# Patient Record
Sex: Female | Born: 1960
Health system: Southern US, Community
[De-identification: ages and names within clinical notes are randomized; demographics above are authoritative.]

## PROBLEM LIST (undated history)

## (undated) DIAGNOSIS — K219 Gastro-esophageal reflux disease without esophagitis: Secondary | ICD-10-CM

## (undated) DIAGNOSIS — I10 Essential (primary) hypertension: Secondary | ICD-10-CM

## (undated) DIAGNOSIS — E785 Hyperlipidemia, unspecified: Secondary | ICD-10-CM

## (undated) DIAGNOSIS — Z9889 Other specified postprocedural states: Secondary | ICD-10-CM

## (undated) DIAGNOSIS — R112 Nausea with vomiting, unspecified: Secondary | ICD-10-CM

## (undated) DIAGNOSIS — F329 Major depressive disorder, single episode, unspecified: Secondary | ICD-10-CM

## (undated) DIAGNOSIS — M1990 Unspecified osteoarthritis, unspecified site: Secondary | ICD-10-CM

## (undated) DIAGNOSIS — E039 Hypothyroidism, unspecified: Secondary | ICD-10-CM

## (undated) DIAGNOSIS — F32A Depression, unspecified: Secondary | ICD-10-CM

## (undated) HISTORY — PX: CARPAL TUNNEL RELEASE: SHX101

## (undated) HISTORY — PX: TRIGGER FINGER RELEASE: SHX641

## (undated) HISTORY — PX: TUBAL LIGATION: SHX77

---

## 1998-01-27 ENCOUNTER — Other Ambulatory Visit: Admission: RE | Admit: 1998-01-27 | Discharge: 1998-01-27 | Payer: Self-pay | Admitting: *Deleted

## 2004-07-27 ENCOUNTER — Encounter: Admission: RE | Admit: 2004-07-27 | Discharge: 2004-07-27 | Payer: Self-pay | Admitting: Family Medicine

## 2005-04-02 ENCOUNTER — Ambulatory Visit (HOSPITAL_COMMUNITY): Admission: RE | Admit: 2005-04-02 | Discharge: 2005-04-02 | Payer: Self-pay | Admitting: Chiropractic Medicine

## 2005-08-24 ENCOUNTER — Emergency Department (HOSPITAL_COMMUNITY): Admission: EM | Admit: 2005-08-24 | Discharge: 2005-08-24 | Payer: Self-pay | Admitting: Emergency Medicine

## 2006-02-20 ENCOUNTER — Ambulatory Visit (HOSPITAL_COMMUNITY): Admission: RE | Admit: 2006-02-20 | Discharge: 2006-02-20 | Payer: Self-pay | Admitting: Endocrinology

## 2007-12-03 ENCOUNTER — Ambulatory Visit (HOSPITAL_COMMUNITY): Admission: RE | Admit: 2007-12-03 | Discharge: 2007-12-03 | Payer: Self-pay | Admitting: Chiropractic Medicine

## 2008-07-23 HISTORY — PX: CHOLECYSTECTOMY: SHX55

## 2008-08-13 ENCOUNTER — Encounter: Admission: RE | Admit: 2008-08-13 | Discharge: 2008-08-13 | Payer: Self-pay | Admitting: Endocrinology

## 2009-03-14 ENCOUNTER — Ambulatory Visit (HOSPITAL_COMMUNITY): Admission: RE | Admit: 2009-03-14 | Discharge: 2009-03-14 | Payer: Self-pay | Admitting: Endocrinology

## 2009-04-26 ENCOUNTER — Observation Stay (HOSPITAL_COMMUNITY): Admission: RE | Admit: 2009-04-26 | Discharge: 2009-04-27 | Payer: Self-pay | Admitting: General Surgery

## 2009-04-26 ENCOUNTER — Encounter (INDEPENDENT_AMBULATORY_CARE_PROVIDER_SITE_OTHER): Payer: Self-pay | Admitting: General Surgery

## 2010-07-18 IMAGING — US US ABDOMEN COMPLETE
1 series · 14 of 25 positions shown · non-contrast
Comparison: None

CLINICAL DATA: Epigastric pain with reflux.

ABDOMEN ULTRASOUND
TECHNIQUE: Complete abdominal ultrasound examination was performed
including evaluation of the liver, gallbladder, bile ducts,
pancreas, kidneys, spleen, IVC, and abdominal aorta.

[Series 1: us abdomen complete · 0.30mm/px · 14 of 81 slices shown]
[im 1/81]
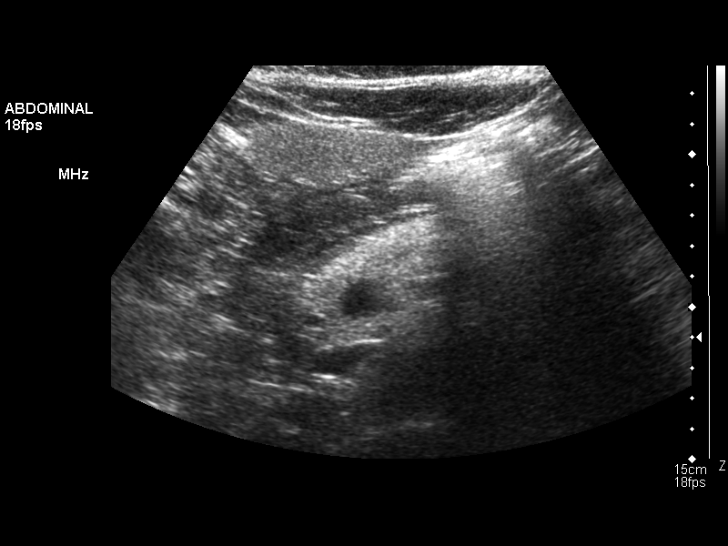
[im 7/81]
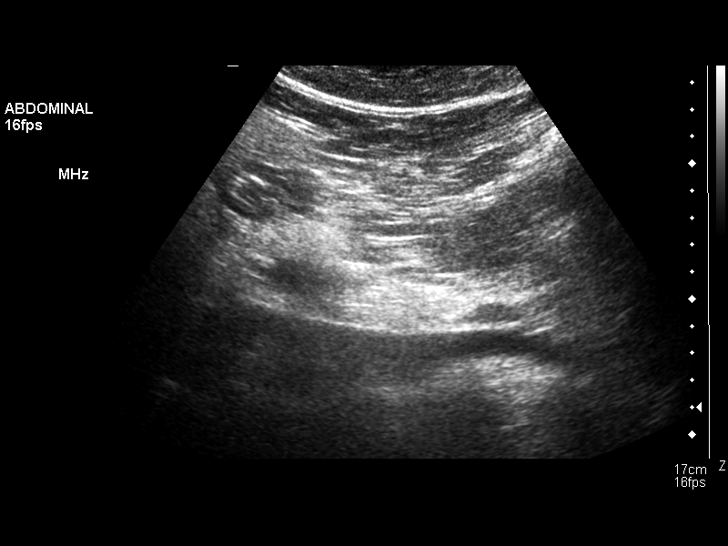
[im 14/81]
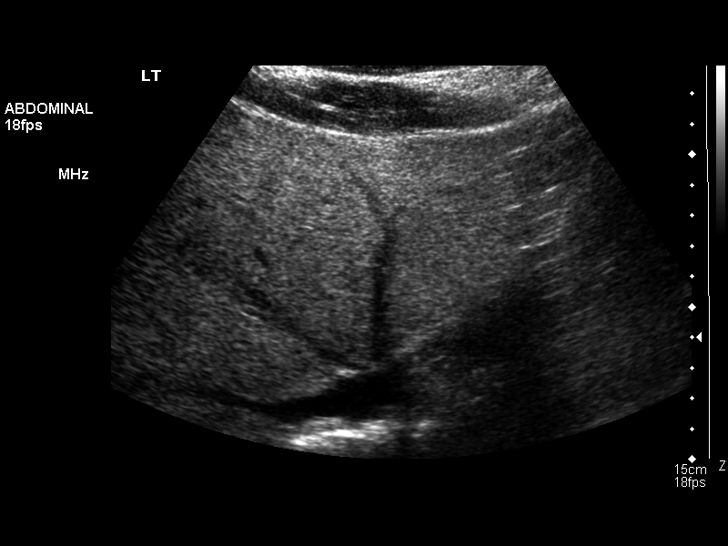
[im 21/81]
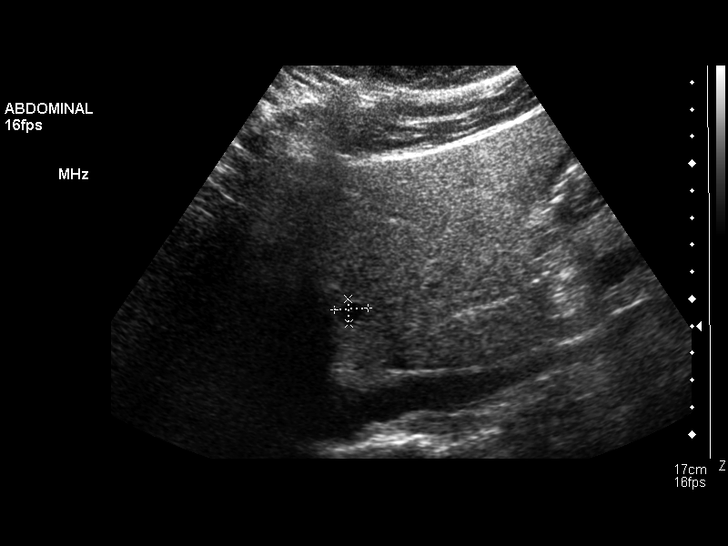
[im 27/81]
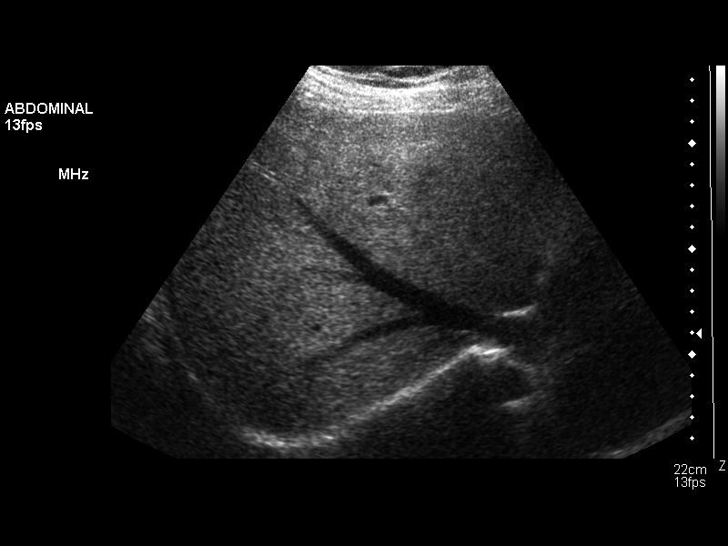
[im 31/81]
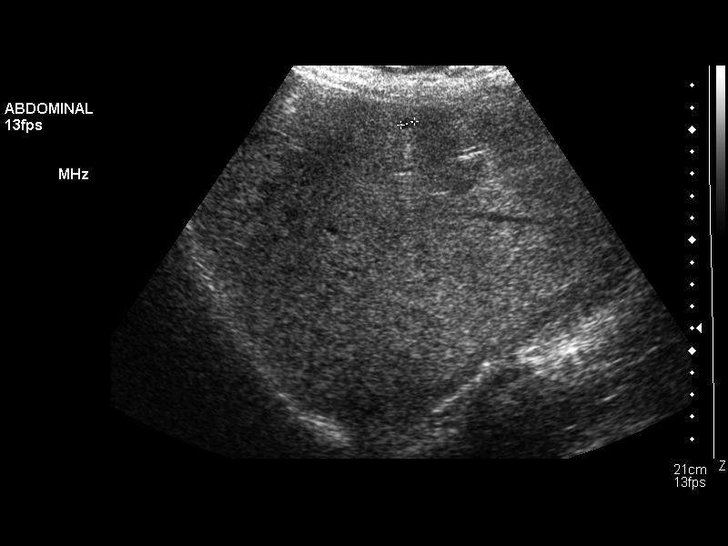
[im 37/81]
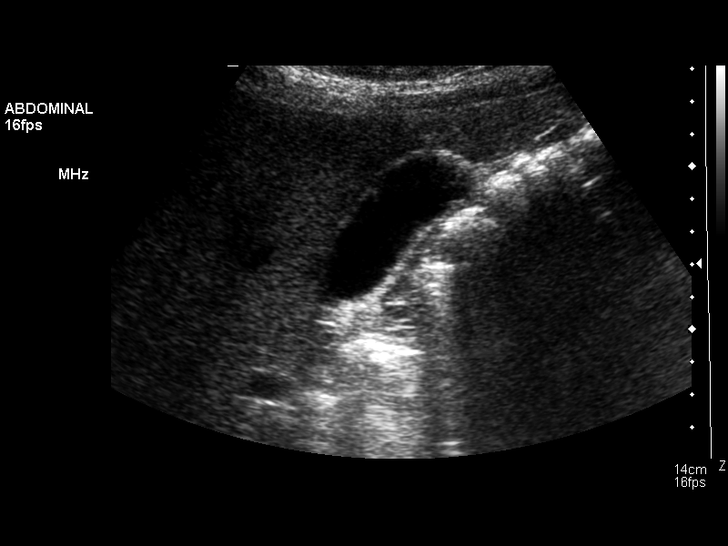
[im 44/81]
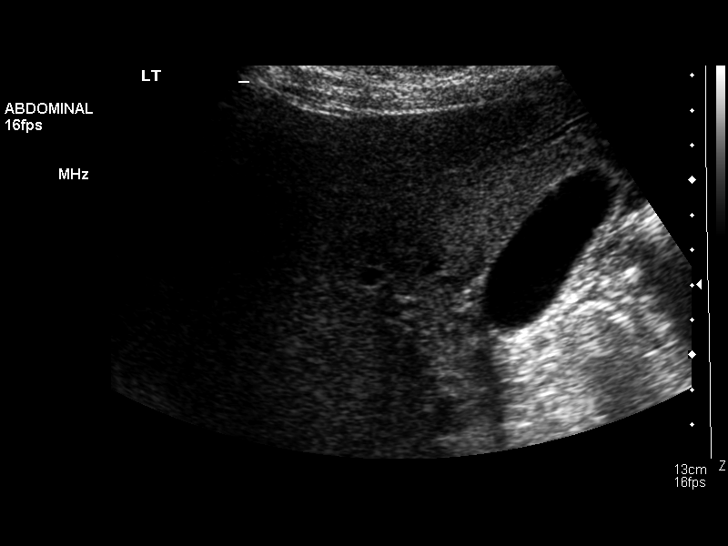
[im 51/81]
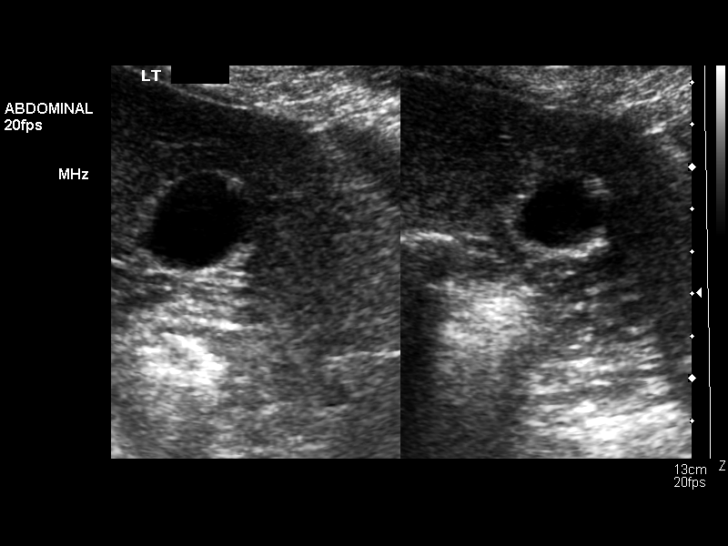
[im 54/81]
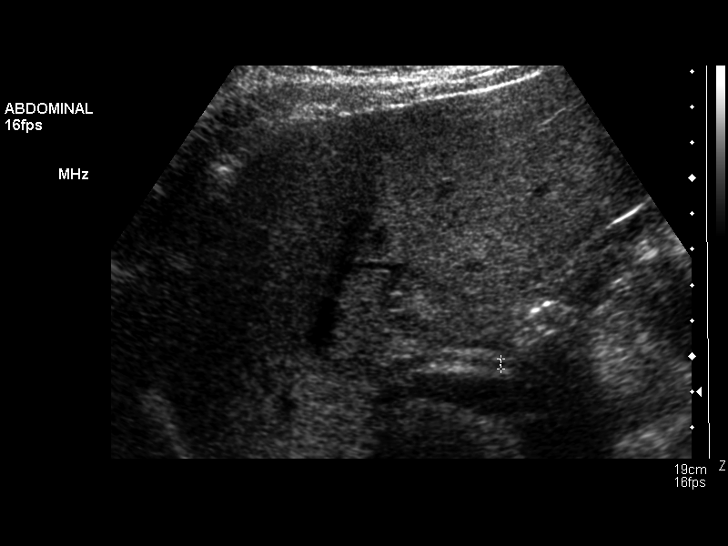
[im 61/81]
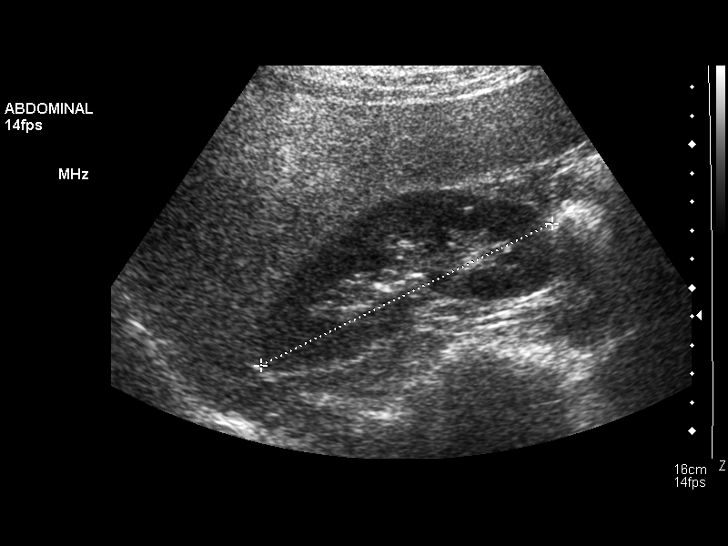
[im 67/81]
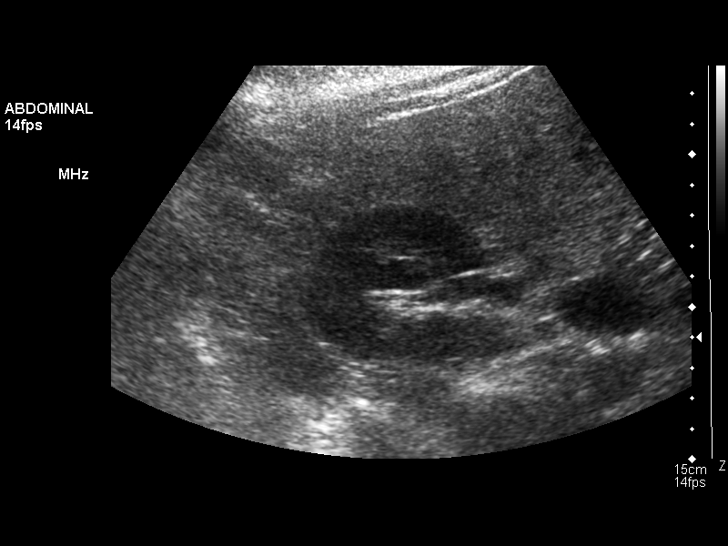
[im 74/81]
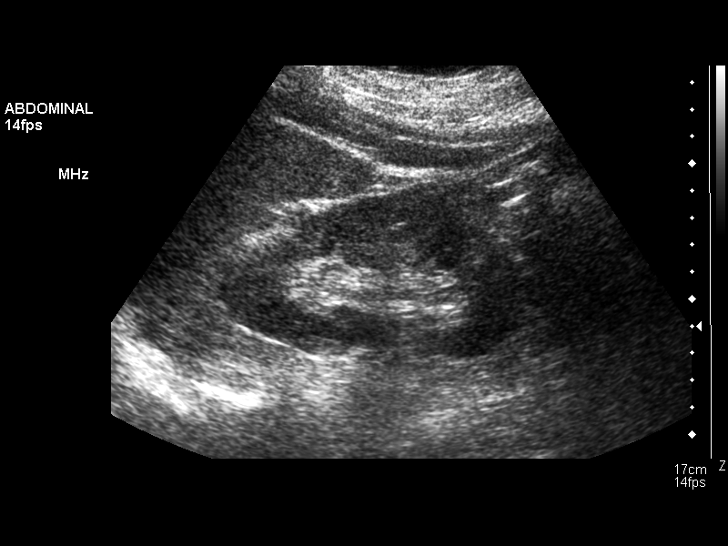
[im 81/81]
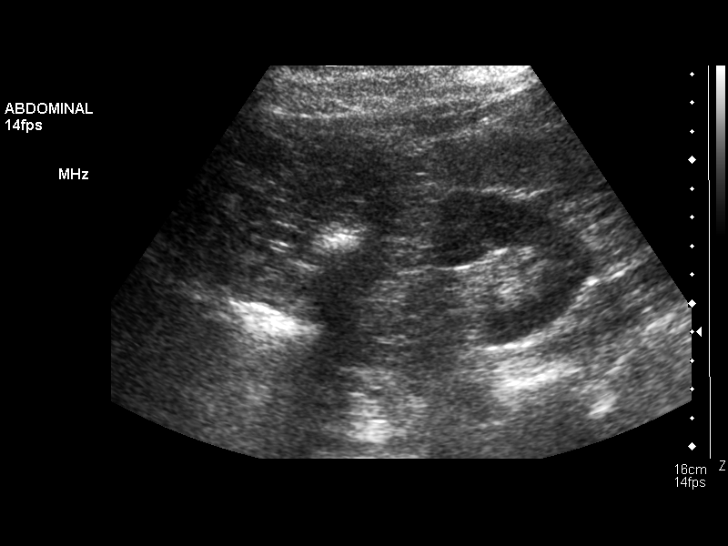

[14 of 25 positions shown; findings below may reference images not displayed]

FINDINGS: Liver is slightly increased in echogenicity and contains
scattered anechoic lesions with increased through transmission,
measuring up to 1.3 x 0.9 x 1.2 cm, consistent with cysts.  There
are tiny echogenic foci along the gallbladder wall, which are non
mobile.  Some of these show ring down artifact.  Gallbladder wall
measures 2 mm.  IVC, visualized portion of the pancreas, spleen,
kidneys and aorta are unremarkable.
IMPRESSION: 1.  Mildly fatty liver.
2.  Probable gallbladder polyps.  Adenomyomatosis cannot be
excluded.

## 2010-09-11 ENCOUNTER — Encounter: Payer: BC Managed Care – PPO | Attending: Endocrinology | Admitting: *Deleted

## 2010-09-11 DIAGNOSIS — Z713 Dietary counseling and surveillance: Secondary | ICD-10-CM | POA: Insufficient documentation

## 2010-09-11 DIAGNOSIS — E669 Obesity, unspecified: Secondary | ICD-10-CM | POA: Insufficient documentation

## 2010-10-27 LAB — BASIC METABOLIC PANEL
BUN: 15 mg/dL (ref 6–23)
CO2: 26 mEq/L (ref 19–32)
Calcium: 8.7 mg/dL (ref 8.4–10.5)
Chloride: 107 mEq/L (ref 96–112)
Creatinine, Ser: 0.73 mg/dL (ref 0.4–1.2)
GFR calc Af Amer: >60 mL/min (ref >60)
GFR calc non Af Amer: >60 mL/min (ref >60)
Glucose, Bld: 103 mg/dL — ABNORMAL HIGH (ref 70–99)
Potassium: 4.3 meq/L (ref 3.5–5.1)
Sodium: 141 meq/L (ref 135–145)

## 2010-10-27 LAB — CBC
HCT: 41.8 % (ref 36.0–46.0)
Hemoglobin: 14.4 g/dL (ref 12.0–15.0)
MCHC: 34.4 g/dL (ref 30.0–36.0)
MCV: 89 fL (ref 78.0–100.0)
Platelets: 193 10*3/uL (ref 150–400)
RBC: 4.69 MIL/uL (ref 3.87–5.11)
RDW: 12.9 % (ref 11.5–15.5)
WBC: 9.6 10*3/uL (ref 4.0–10.5)

## 2010-12-22 ENCOUNTER — Other Ambulatory Visit: Payer: Self-pay | Admitting: Gastroenterology

## 2011-02-16 IMAGING — NM NM HEPATO W/GB/PHARM/[PERSON_NAME]
1 series · 12 of 12 positions shown · non-contrast
Comparison: Ultrasound 08/13/2008

Addendum Begins

Addendum:  Please disregard the third sentence in the findings
first paragraph (gallbladder appears small or contracted.). This
was mistakenly reported for the wrong patient.  Remainder of the
report is accurate.
Addendum Ends
CLINICAL DATA: Right upper quadrant pain, nausea, vomiting.
NUCLEAR MEDICINE HEPATOBILIARY IMAGING WITH GALLBLADDER EF
TECHNIQUE: Sequential images of the abdomen were obtained [DATE]
minutes following intravenous administration of
radiopharmaceutical.  After slow intravenous infusion of 1.4ucg
Cholecystokinin, gallbladder ejection fraction was determined.
Radiopharmaceutical:  F.FmXi Yc-JJm Choletec

[Series 1: antr · 4.46mm/px · 2 acquisitions, 12 frames shown]
[im 1/2]
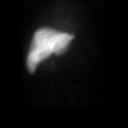
[im 1/2]
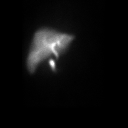
[im 1/2]
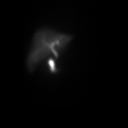
[im 1/2]
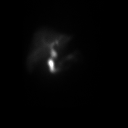
[im 1/2]
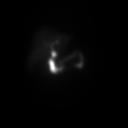
[im 1/2]
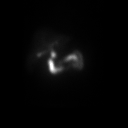
[im 2/2]
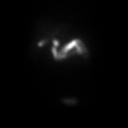
[im 2/2]
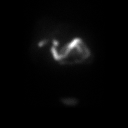
[im 2/2]
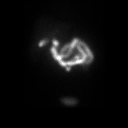
[im 2/2]
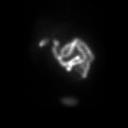
[im 2/2]
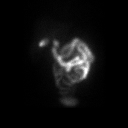
[im 2/2]
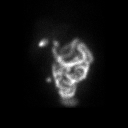

[12 of 12 positions shown; findings below may reference images not displayed]

FINDINGS: There is prompt uptake and excretion of radiotracer by
the liver.  No evidence of cystic duct or common duct obstruction.
Gallbladder appears small bore contracted. Gallbladder ejection
fraction is 31%.  Normal is greater than 30%.

The patient did not experience symptoms during CCK infusion.
IMPRESSION: Gallbladder ejection fraction lower limits of normal.  No symptoms
with CCK administration.

## 2013-07-06 ENCOUNTER — Other Ambulatory Visit: Payer: Self-pay | Admitting: Orthopaedic Surgery

## 2013-07-07 ENCOUNTER — Other Ambulatory Visit: Payer: Self-pay | Admitting: Orthopaedic Surgery

## 2013-07-08 ENCOUNTER — Encounter (HOSPITAL_BASED_OUTPATIENT_CLINIC_OR_DEPARTMENT_OTHER): Payer: Self-pay | Admitting: *Deleted

## 2013-07-08 NOTE — Progress Notes (Signed)
07/08/13 1610  OBSTRUCTIVE SLEEP APNEA  Have you ever been diagnosed with sleep apnea through a sleep study? No  Do you snore loudly (loud enough to be heard through closed doors)?  0  Do you often feel tired, fatigued, or sleepy during the daytime? 0  Has anyone observed you stop breathing during your sleep? 0  Do you have, or are you being treated for high blood pressure? 1  BMI more than 35 kg/m2? 1  Age over 54 years old? 1  Neck circumference greater than 40 cm/18 inches? 1  Gender: 0  Obstructive Sleep Apnea Score 4  Score 4 or greater  Results sent to PCP

## 2013-07-08 NOTE — Progress Notes (Signed)
To come in for ekg-bmet-denies any cardiac or resp-denies sleep apnea

## 2013-07-09 ENCOUNTER — Encounter (HOSPITAL_BASED_OUTPATIENT_CLINIC_OR_DEPARTMENT_OTHER)
Admission: RE | Admit: 2013-07-09 | Discharge: 2013-07-09 | Disposition: A | Discharge status: Home / Self Care | Payer: BC Managed Care – PPO | Source: Ambulatory Visit | Attending: Orthopaedic Surgery | Admitting: Orthopaedic Surgery

## 2013-07-09 ENCOUNTER — Other Ambulatory Visit: Payer: Self-pay

## 2013-07-09 DIAGNOSIS — Z01810 Encounter for preprocedural cardiovascular examination: Secondary | ICD-10-CM | POA: Insufficient documentation

## 2013-07-09 DIAGNOSIS — Z01812 Encounter for preprocedural laboratory examination: Secondary | ICD-10-CM | POA: Insufficient documentation

## 2013-07-09 LAB — BASIC METABOLIC PANEL
BUN: 13 mg/dL (ref 6–23)
CO2: 27 mEq/L (ref 19–32)
Calcium: 9.2 mg/dL (ref 8.4–10.5)
Chloride: 102 meq/L (ref 96–112)
Creatinine, Ser: 0.7 mg/dL (ref 0.50–1.10)
GFR calc Af Amer: >90 mL/min (ref >90)
GFR calc non Af Amer: >90 mL/min (ref >90)
Glucose, Bld: 96 mg/dL (ref 70–99)
Potassium: 4 mEq/L (ref 3.5–5.1)
Sodium: 139 mEq/L (ref 135–145)

## 2013-07-10 NOTE — H&P (Signed)
Wendy Reed is an 52 y.o. female.   Chief Complaint: Left shoulder pain HPI: Wendy Reed has having increasing left shoulder pain that is painful with activities and at nighttime on trying to rest.  She has been on a exercise program, anti-inflammatory medicine and injection of cortisone which all helped temporarily.  Recent MRI scan is positive for mild glenohumeral and a.c. Degeneration .  Rotator cuff is irritated but no full-thickness tear.  Date of scan 06/14/13.  We have discussed proceeding with a shoulder arthroscopy to relieve painful symptoms and improve function.  Past Medical History  Diagnosis Date  . Hypertension   . Hyperlipemia   . Arthritis   . GERD (gastroesophageal reflux disease)   . Hypothyroidism   . Depression   . PONV (postoperative nausea and vomiting)     Past Surgical History  Procedure Laterality Date  . Cesarean section    . Cholecystectomy  2010    lap choli  . Carpal tunnel release      right and left  . Trigger finger release      lt thumb  . Tubal ligation      History reviewed. No pertinent family history. Social History:  reports that she quit smoking about 9 years ago. She does not have any smokeless tobacco history on file. She reports that she does not drink alcohol or use illicit drugs.  Allergies:  Allergies  Allergen Reactions  . Codeine Nausea And Vomiting  . Penicillins Hives    No prescriptions prior to admission    Results for orders placed during the hospital encounter of 07/14/13 (from the past 48 hour(s))  BASIC METABOLIC PANEL     Status: None   Collection Time    07/09/13 12:00 PM      Result Value Range   Sodium 139  135 - 145 mEq/L   Potassium 4.0  3.5 - 5.1 mEq/L   Chloride 102  96 - 112 mEq/L   CO2 27  19 - 32 mEq/L   Glucose, Bld 96  70 - 99 mg/dL   BUN 13  6 - 23 mg/dL   Creatinine, Ser 2.95  0.50 - 1.10 mg/dL   Calcium 9.2  8.4 - 62.1 mg/dL   GFR calc non Af Amer >90  >90 mL/min   GFR calc Af Amer >90  >90  mL/min   Comment: (NOTE)     The eGFR has been calculated using the CKD EPI equation.     This calculation has not been validated in all clinical situations.     eGFR's persistently <90 mL/min signify possible Chronic Kidney     Disease.   No results found.  Review of Systems  Constitutional: Negative.   HENT: Negative.   Eyes: Negative.   Respiratory: Negative.   Cardiovascular: Negative.   Genitourinary: Negative.   Musculoskeletal: Positive for joint pain.  Skin: Negative.   Neurological: Negative.   Endo/Heme/Allergies: Negative.   Psychiatric/Behavioral: Negative.     Height 5\' 4"  (1.626 m), weight 117.028 kg (258 lb). Physical Exam  Constitutional: She appears well-nourished.  HENT:  Head: Normocephalic.  Eyes: Pupils are equal, round, and reactive to light.  Neck: Normal range of motion.  Cardiovascular: Normal rate.   Respiratory: Effort normal.  GI: Soft.  Musculoskeletal:  Left shoulder exam: Range of motion: 150, 45 and hip pocket.  Cuff strength testing is good.  She does have mildly positive cross chest test pain for a.c. Joint discomfort.  Marked secondary impingement  and primary impingement both.  Neurological: She is alert.  Skin: Skin is dry.     Assessment/Plan Left shoulder impingement and a.c. Joint pain.  Plan: We have discussed proceeding with a left shoulder arthroscopy to stop her painful symptoms and improve her function.  We have discussed the risks of anesthesia, infection and Complications associated with shoulder arthroscopy.We have also discussed the need for postoperative physical therapy to optimize results.  Wendy Reed R 07/10/2013, 1:18 PM

## 2013-07-14 ENCOUNTER — Ambulatory Visit (HOSPITAL_BASED_OUTPATIENT_CLINIC_OR_DEPARTMENT_OTHER): Payer: BC Managed Care – PPO | Admitting: Anesthesiology

## 2013-07-14 ENCOUNTER — Ambulatory Visit (HOSPITAL_BASED_OUTPATIENT_CLINIC_OR_DEPARTMENT_OTHER)
Admission: RE | Admit: 2013-07-14 | Discharge: 2013-07-14 | Disposition: A | Discharge status: Home / Self Care | Payer: BC Managed Care – PPO | Source: Ambulatory Visit | Attending: Orthopaedic Surgery | Admitting: Orthopaedic Surgery

## 2013-07-14 ENCOUNTER — Encounter (HOSPITAL_BASED_OUTPATIENT_CLINIC_OR_DEPARTMENT_OTHER): Payer: Self-pay | Admitting: *Deleted

## 2013-07-14 ENCOUNTER — Encounter (HOSPITAL_BASED_OUTPATIENT_CLINIC_OR_DEPARTMENT_OTHER): Payer: BC Managed Care – PPO | Admitting: Anesthesiology

## 2013-07-14 ENCOUNTER — Encounter (HOSPITAL_BASED_OUTPATIENT_CLINIC_OR_DEPARTMENT_OTHER)
Admission: RE | Disposition: A | Discharge status: Home / Self Care | Payer: Self-pay | Source: Ambulatory Visit | Attending: Orthopaedic Surgery

## 2013-07-14 DIAGNOSIS — I10 Essential (primary) hypertension: Secondary | ICD-10-CM | POA: Insufficient documentation

## 2013-07-14 DIAGNOSIS — E785 Hyperlipidemia, unspecified: Secondary | ICD-10-CM | POA: Insufficient documentation

## 2013-07-14 DIAGNOSIS — F329 Major depressive disorder, single episode, unspecified: Secondary | ICD-10-CM | POA: Insufficient documentation

## 2013-07-14 DIAGNOSIS — Z9889 Other specified postprocedural states: Secondary | ICD-10-CM

## 2013-07-14 DIAGNOSIS — E039 Hypothyroidism, unspecified: Secondary | ICD-10-CM | POA: Insufficient documentation

## 2013-07-14 DIAGNOSIS — M75110 Incomplete rotator cuff tear or rupture of unspecified shoulder, not specified as traumatic: Secondary | ICD-10-CM | POA: Insufficient documentation

## 2013-07-14 DIAGNOSIS — Z87891 Personal history of nicotine dependence: Secondary | ICD-10-CM | POA: Insufficient documentation

## 2013-07-14 DIAGNOSIS — K219 Gastro-esophageal reflux disease without esophagitis: Secondary | ICD-10-CM | POA: Insufficient documentation

## 2013-07-14 DIAGNOSIS — M19019 Primary osteoarthritis, unspecified shoulder: Secondary | ICD-10-CM | POA: Insufficient documentation

## 2013-07-14 DIAGNOSIS — F3289 Other specified depressive episodes: Secondary | ICD-10-CM | POA: Insufficient documentation

## 2013-07-14 HISTORY — DX: Major depressive disorder, single episode, unspecified: F32.9

## 2013-07-14 HISTORY — DX: Hypothyroidism, unspecified: E03.9

## 2013-07-14 HISTORY — DX: Gastro-esophageal reflux disease without esophagitis: K21.9

## 2013-07-14 HISTORY — PX: SHOULDER ARTHROSCOPY: SHX128

## 2013-07-14 HISTORY — DX: Other specified postprocedural states: Z98.890

## 2013-07-14 HISTORY — DX: Nausea with vomiting, unspecified: R11.2

## 2013-07-14 HISTORY — DX: Essential (primary) hypertension: I10

## 2013-07-14 HISTORY — DX: Depression, unspecified: F32.A

## 2013-07-14 HISTORY — DX: Unspecified osteoarthritis, unspecified site: M19.90

## 2013-07-14 HISTORY — DX: Hyperlipidemia, unspecified: E78.5

## 2013-07-14 LAB — POCT HEMOGLOBIN-HEMACUE: Hemoglobin: 14.4 g/dL (ref 12.0–15.0)

## 2013-07-14 SURGERY — ARTHROSCOPY, SHOULDER
Anesthesia: General | Site: Shoulder | Laterality: Left

## 2013-07-14 MED ORDER — PROPOFOL 10 MG/ML IV BOLUS
INTRAVENOUS | Status: DC | PRN
  Administered 2013-07-14: 200 mg via INTRAVENOUS

## 2013-07-14 MED ORDER — SUCCINYLCHOLINE CHLORIDE 20 MG/ML IJ SOLN
INTRAMUSCULAR | Status: DC | PRN
  Administered 2013-07-14: 100 mg via INTRAVENOUS

## 2013-07-14 MED ORDER — ONDANSETRON HCL 4 MG/2ML IJ SOLN: 4.0000 mg | Freq: Once | INTRAMUSCULAR | Status: DC | PRN

## 2013-07-14 MED ORDER — SODIUM CHLORIDE 0.9 % IV SOLN
INTRAVENOUS | Status: DC | PRN
  Administered 2013-07-14: 6000 mL

## 2013-07-14 MED ORDER — FENTANYL CITRATE 0.05 MG/ML IJ SOLN
INTRAMUSCULAR | Status: AC
  Filled 2013-07-14: qty 4

## 2013-07-14 MED ORDER — BUPIVACAINE-EPINEPHRINE PF 0.5-1:200000 % IJ SOLN
INTRAMUSCULAR | Status: DC | PRN
  Administered 2013-07-14: 30 mL via PERINEURAL

## 2013-07-14 MED ORDER — FENTANYL CITRATE 0.05 MG/ML IJ SOLN
INTRAMUSCULAR | Status: AC
  Filled 2013-07-14: qty 2

## 2013-07-14 MED ORDER — MIDAZOLAM HCL 2 MG/2ML IJ SOLN
1.0000 mg | INTRAMUSCULAR | Status: DC | PRN
  Administered 2013-07-14: 2 mg via INTRAVENOUS

## 2013-07-14 MED ORDER — OXYCODONE HCL 5 MG/5ML PO SOLN: 5.0000 mg | Freq: Once | ORAL | Status: DC | PRN

## 2013-07-14 MED ORDER — DEXAMETHASONE SODIUM PHOSPHATE 4 MG/ML IJ SOLN
INTRAMUSCULAR | Status: DC | PRN
  Administered 2013-07-14: 10 mg via INTRAVENOUS

## 2013-07-14 MED ORDER — FENTANYL CITRATE 0.05 MG/ML IJ SOLN
50.0000 ug | INTRAMUSCULAR | Status: DC | PRN
  Administered 2013-07-14: 100 ug via INTRAVENOUS

## 2013-07-14 MED ORDER — LIDOCAINE HCL (CARDIAC) 20 MG/ML IV SOLN
INTRAVENOUS | Status: DC | PRN
  Administered 2013-07-14: 100 mg via INTRAVENOUS

## 2013-07-14 MED ORDER — LACTATED RINGERS IV SOLN
INTRAVENOUS | Status: DC
  Administered 2013-07-14: 07:00:00 via INTRAVENOUS

## 2013-07-14 MED ORDER — ONDANSETRON HCL 4 MG/2ML IJ SOLN
INTRAMUSCULAR | Status: DC | PRN
  Administered 2013-07-14: 4 mg via INTRAVENOUS

## 2013-07-14 MED ORDER — METHYLPREDNISOLONE ACETATE 40 MG/ML IJ SUSP
INTRAMUSCULAR | Status: DC | PRN
  Administered 2013-07-14: 40 mg via INTRA_ARTICULAR

## 2013-07-14 MED ORDER — HYDROMORPHONE HCL PF 1 MG/ML IJ SOLN: 0.2500 mg | INTRAMUSCULAR | Status: DC | PRN

## 2013-07-14 MED ORDER — BUPIVACAINE-EPINEPHRINE 0.25% -1:200000 IJ SOLN
INTRAMUSCULAR | Status: DC | PRN
  Administered 2013-07-14: 5 mL

## 2013-07-14 MED ORDER — CEFAZOLIN SODIUM-DEXTROSE 2-3 GM-% IV SOLR
INTRAVENOUS | Status: DC | PRN
  Administered 2013-07-14: 2 g via INTRAVENOUS

## 2013-07-14 MED ORDER — MIDAZOLAM HCL 2 MG/2ML IJ SOLN
INTRAMUSCULAR | Status: AC
  Filled 2013-07-14: qty 2

## 2013-07-14 MED ORDER — METHYLPREDNISOLONE ACETATE 40 MG/ML IJ SUSP
INTRAMUSCULAR | Status: AC
  Filled 2013-07-14: qty 1

## 2013-07-14 MED ORDER — LACTATED RINGERS IV SOLN: INTRAVENOUS | Status: DC

## 2013-07-14 MED ORDER — CHLORHEXIDINE GLUCONATE 4 % EX LIQD: 60.0000 mL | Freq: Once | CUTANEOUS | Status: DC

## 2013-07-14 MED ORDER — MEPERIDINE HCL 25 MG/ML IJ SOLN: 6.2500 mg | INTRAMUSCULAR | Status: DC | PRN

## 2013-07-14 MED ORDER — CEFAZOLIN SODIUM-DEXTROSE 2-3 GM-% IV SOLR
INTRAVENOUS | Status: AC
  Filled 2013-07-14: qty 50

## 2013-07-14 MED ORDER — OXYCODONE HCL 5 MG PO TABS: 5.0000 mg | ORAL_TABLET | Freq: Once | ORAL | Status: DC | PRN

## 2013-07-14 MED ORDER — BUPIVACAINE-EPINEPHRINE PF 0.25-1:200000 % IJ SOLN
INTRAMUSCULAR | Status: AC
  Filled 2013-07-14: qty 30

## 2013-07-14 MED ORDER — TRAMADOL HCL 50 MG PO TABS: 50.0000 mg | ORAL_TABLET | Freq: Four times a day (QID) | ORAL | Status: AC | PRN

## 2013-07-14 SURGICAL SUPPLY — 74 items
ADH SKN CLS APL DERMABOND .7 (GAUZE/BANDAGES/DRESSINGS)
APL SKNCLS STERI-STRIP NONHPOA (GAUZE/BANDAGES/DRESSINGS)
BENZOIN TINCTURE PRP APPL 2/3 (GAUZE/BANDAGES/DRESSINGS) IMPLANT
BLADE CUDA 5.5 (BLADE) IMPLANT
BLADE GREAT WHITE 4.2 (BLADE) ×2 IMPLANT
BLADE SURG 15 STRL LF DISP TIS (BLADE) IMPLANT
BLADE SURG 15 STRL SS (BLADE)
BUR VERTEX HOODED 4.5 (BURR) ×2 IMPLANT
CANISTER SUCT 3000ML (MISCELLANEOUS) IMPLANT
CANISTER SUCT LVC 12 LTR MEDI- (MISCELLANEOUS) ×2 IMPLANT
CANNULA SHOULDER 7CM (CANNULA) ×2 IMPLANT
CANNULA TWIST IN 8.25X7CM (CANNULA) IMPLANT
DECANTER SPIKE VIAL GLASS SM (MISCELLANEOUS) IMPLANT
DERMABOND ADVANCED (GAUZE/BANDAGES/DRESSINGS)
DERMABOND ADVANCED .7 DNX12 (GAUZE/BANDAGES/DRESSINGS) IMPLANT
DRAPE STERI 35X30 U-POUCH (DRAPES) ×2 IMPLANT
DRAPE U-SHAPE 47X51 STRL (DRAPES) ×2 IMPLANT
DRAPE U-SHAPE 76X120 STRL (DRAPES) ×4 IMPLANT
DRSG EMULSION OIL 3X3 NADH (GAUZE/BANDAGES/DRESSINGS) ×2 IMPLANT
DURAPREP 26ML APPLICATOR (WOUND CARE) ×2 IMPLANT
ELECT MENISCUS 165MM 90D (ELECTRODE) IMPLANT
ELECT REM PT RETURN 9FT ADLT (ELECTROSURGICAL) ×2
ELECTRODE REM PT RTRN 9FT ADLT (ELECTROSURGICAL) ×1 IMPLANT
GLOVE BIO SURGEON STRL SZ8 (GLOVE) ×1 IMPLANT
GLOVE BIO SURGEON STRL SZ8.5 (GLOVE) ×2 IMPLANT
GLOVE BIOGEL PI IND STRL 7.0 (GLOVE) ×2 IMPLANT
GLOVE BIOGEL PI IND STRL 8 (GLOVE) ×1 IMPLANT
GLOVE BIOGEL PI IND STRL 8.5 (GLOVE) ×1 IMPLANT
GLOVE BIOGEL PI INDICATOR 7.0 (GLOVE) ×2
GLOVE BIOGEL PI INDICATOR 8 (GLOVE) ×2
GLOVE BIOGEL PI INDICATOR 8.5 (GLOVE)
GLOVE ECLIPSE 6.5 STRL STRAW (GLOVE) ×2 IMPLANT
GLOVE SS BIOGEL STRL SZ 8 (GLOVE) ×1 IMPLANT
GLOVE SUPERSENSE BIOGEL SZ 8 (GLOVE) ×1
GOWN BRE IMP PREV XXLGXLNG (GOWN DISPOSABLE) ×2 IMPLANT
GOWN PREVENTION PLUS XLARGE (GOWN DISPOSABLE) ×2 IMPLANT
GOWN PREVENTION PLUS XXLARGE (GOWN DISPOSABLE) ×2 IMPLANT
NDL SCORPION MULTI FIRE (NEEDLE) IMPLANT
NDL SUT 6 .5 CRC .975X.05 MAYO (NEEDLE) IMPLANT
NEEDLE MAYO TAPER (NEEDLE)
NEEDLE SCORPION MULTI FIRE (NEEDLE) IMPLANT
NS IRRIG 1000ML POUR BTL (IV SOLUTION) IMPLANT
PACK ARTHROSCOPY DSU (CUSTOM PROCEDURE TRAY) ×2 IMPLANT
PACK BASIN DAY SURGERY FS (CUSTOM PROCEDURE TRAY) ×2 IMPLANT
PAD ABD 8X10 STRL (GAUZE/BANDAGES/DRESSINGS) ×2 IMPLANT
PASSER SUT SWANSON 36MM LOOP (INSTRUMENTS) IMPLANT
PENCIL BUTTON HOLSTER BLD 10FT (ELECTRODE) IMPLANT
SET ARTHROSCOPY TUBING (MISCELLANEOUS) ×2
SET ARTHROSCOPY TUBING LN (MISCELLANEOUS) ×1 IMPLANT
SHEET MEDIUM DRAPE 40X70 STRL (DRAPES) ×1 IMPLANT
SLEEVE SCD COMPRESS KNEE MED (MISCELLANEOUS) ×1 IMPLANT
SLING ARM FOAM STRAP LRG (SOFTGOODS) IMPLANT
SLING ARM FOAM STRAP MED (SOFTGOODS) IMPLANT
SLING ARM FOAM STRAP SML (SOFTGOODS) IMPLANT
SLING ARM FOAM STRAP XLG (SOFTGOODS) ×1 IMPLANT
SPONGE GAUZE 4X4 12PLY (GAUZE/BANDAGES/DRESSINGS) ×2 IMPLANT
SPONGE LAP 4X18 X RAY DECT (DISPOSABLE) IMPLANT
STRIP CLOSURE SKIN 1/2X4 (GAUZE/BANDAGES/DRESSINGS) IMPLANT
SUCTION FRAZIER TIP 10 FR DISP (SUCTIONS) IMPLANT
SUT ETHIBOND 2 OS 4 DA (SUTURE) IMPLANT
SUT ETHILON 3 0 PS 1 (SUTURE) ×2 IMPLANT
SUT FIBERWIRE #2 38 T-5 BLUE (SUTURE)
SUT PDS AB 2-0 CT2 27 (SUTURE) IMPLANT
SUT VIC AB 0 SH 27 (SUTURE) IMPLANT
SUT VIC AB 2-0 SH 27 (SUTURE)
SUT VIC AB 2-0 SH 27XBRD (SUTURE) IMPLANT
SUT VICRYL 4-0 PS2 18IN ABS (SUTURE) IMPLANT
SUTURE FIBERWR #2 38 T-5 BLUE (SUTURE) IMPLANT
SYR BULB 3OZ (MISCELLANEOUS) IMPLANT
TOWEL OR 17X24 6PK STRL BLUE (TOWEL DISPOSABLE) ×2 IMPLANT
TOWEL OR NON WOVEN STRL DISP B (DISPOSABLE) ×2 IMPLANT
WAND STAR VAC 90 (SURGICAL WAND) ×2 IMPLANT
WATER STERILE IRR 1000ML POUR (IV SOLUTION) ×2 IMPLANT
YANKAUER SUCT BULB TIP NO VENT (SUCTIONS) IMPLANT

## 2013-07-14 NOTE — Anesthesia Postprocedure Evaluation (Signed)
Anesthesia Post Note  Patient: Wendy Reed  Procedure(s) Performed: Procedure(s) (LRB): ARTHROSCOPY LEFT SHOULDER, SUBACROMIAL DECOMPRESSION, DISTAL CLAVICLE RESECTION (Left)  Anesthesia type: general  Patient location: PACU  Post pain: Pain level controlled  Post assessment: Patient's Cardiovascular Status Stable  Last Vitals:  Filed Vitals:   07/14/13 1022  BP: 122/69  Pulse: 68  Temp: 36.6 C  Resp: 16    Post vital signs: Reviewed and stable  Level of consciousness: sedated  Complications: No apparent anesthesia complications

## 2013-07-14 NOTE — Anesthesia Procedure Notes (Addendum)
Anesthesia Regional Block:  Interscalene brachial plexus block  Pre-Anesthetic Checklist: ,, timeout performed, Correct Patient, Correct Site, Correct Laterality, Correct Procedure, Correct Position, site marked, Risks and benefits discussed,  Surgical consent,  Pre-op evaluation,  At surgeon's request and post-op pain management  Laterality: Left  Prep: chloraprep       Needles:  Injection technique: Single-shot  Needle Type: Other      Needle Gauge: 22 and 22 G  Needle insertion depth: 5 cm   Additional Needles:  Procedures: nerve stimulator Interscalene brachial plexus block Narrative:  Start time: 07/14/2013 7:06 AM End time: 07/14/2013 7:12 AM Injection made incrementally with aspirations every 5 mL.  Performed by: Personally   Additional Notes: Monitors applied. Patient sedated. Sterile prep and drape,hand hygiene and sterile gloves were used. Needle position confirmed with evoked response at 0.4 mV.Local anesthetic injected incrementally after negative aspiration.Vascular puncture avoided. No complications. The patient tolerated the procedure well.        Procedure Name: Intubation Performed by: Lance Coon Pre-anesthesia Checklist: Patient identified, Emergency Drugs available, Suction available and Patient being monitored Patient Re-evaluated:Patient Re-evaluated prior to inductionOxygen Delivery Method: Circle System Utilized Preoxygenation: Pre-oxygenation with 100% oxygen Intubation Type: IV induction Ventilation: Mask ventilation without difficulty Grade View: Grade II Tube type: Oral Number of attempts: 1 Airway Equipment and Method: stylet,  oral airway and Video-laryngoscopy Placement Confirmation: ETT inserted through vocal cords under direct vision,  positive ETCO2 and breath sounds checked- equal and bilateral Tube secured with: Tape Dental Injury: Teeth and Oropharynx as per pre-operative assessment  Difficulty Due To: Difficult Airway- due  to large tongue

## 2013-07-14 NOTE — Interval H&P Note (Signed)
History and Physical Interval Note:  07/14/2013 7:26 AM  Wendy Reed  has presented today for surgery, with the diagnosis of Left Shoulder Impingment and AC Joing DJD  The various methods of treatment have been discussed with the patient and family. After consideration of risks, benefits and other options for treatment, the patient has consented to  Procedure(s): ARTHROSCOPY LEFT SHOULDER (Left) as a surgical intervention .  The patient's history has been reviewed, patient examined, no change in status, stable for surgery.  I have reviewed the patient's chart and labs.  Questions were answered to the patient's satisfaction.     Youcef Klas G

## 2013-07-14 NOTE — Transfer of Care (Signed)
Immediate Anesthesia Transfer of Care Note  Patient: Wendy Reed  Procedure(s) Performed: Procedure(s): ARTHROSCOPY LEFT SHOULDER, SUBACROMIAL DECOMPRESSION, DISTAL CLAVICLE RESECTION (Left)  Patient Location: PACU  Anesthesia Type:GA combined with regional for post-op pain  Level of Consciousness: awake and patient cooperative  Airway & Oxygen Therapy: Patient Spontanous Breathing and Patient connected to face mask oxygen  Post-op Assessment: Report given to PACU RN and Post -op Vital signs reviewed and stable  Post vital signs: Reviewed and stable  Complications: No apparent anesthesia complications

## 2013-07-14 NOTE — Progress Notes (Signed)
AssistedDr. Ossey with left, ultrasound guided, interscalene  block. Side rails up, monitors on throughout procedure. See vital signs in flow sheet. Tolerated Procedure well.  

## 2013-07-14 NOTE — Anesthesia Preprocedure Evaluation (Signed)
Anesthesia Evaluation  Patient identified by MRN, date of birth, ID band Patient awake    Reviewed: Allergy & Precautions, H&P , NPO status   History of Anesthesia Complications (+) PONV  Airway Mallampati: I TM Distance: >3 FB Neck ROM: Full    Dental   Pulmonary former smoker,          Cardiovascular hypertension, Pt. on medications     Neuro/Psych Depression    GI/Hepatic GERD-  Controlled and Medicated,  Endo/Other  Hypothyroidism   Renal/GU      Musculoskeletal   Abdominal   Peds  Hematology   Anesthesia Other Findings   Reproductive/Obstetrics                           Anesthesia Physical Anesthesia Plan  ASA: II  Anesthesia Plan: General   Post-op Pain Management:    Induction: Intravenous  Airway Management Planned: Oral ETT  Additional Equipment:   Intra-op Plan:   Post-operative Plan: Extubation in OR  Informed Consent: I have reviewed the patients History and Physical, chart, labs and discussed the procedure including the risks, benefits and alternatives for the proposed anesthesia with the patient or authorized representative who has indicated his/her understanding and acceptance.     Plan Discussed with: CRNA and Surgeon  Anesthesia Plan Comments:         Anesthesia Quick Evaluation

## 2013-07-14 NOTE — Op Note (Signed)
NAMEENDIA, MONCUR                ACCOUNT NO.:  [id number]  MEDICAL RECORD NO.:  [id number]  LOCATION:                               FACILITY:  [facility]  PHYSICIAN:  Lubertha Basque. Shealyn Sean, M.D.DATE OF BIRTH:  16-May-1961  DATE OF PROCEDURE:  07/14/2013 DATE OF DISCHARGE:  07/14/2013                              OPERATIVE REPORT   PREOPERATIVE DIAGNOSES: 1. Right shoulder acromioclavicular degeneration. 2. Right shoulder partial rotator cuff tear. 3. Left shoulder degenerative joint disease.  POSTOPERATIVE DIAGNOSES: 1. Right shoulder acromioclavicular degeneration. 2. Right shoulder partial rotator cuff tear. 3. Left shoulder degenerative joint disease.  PROCEDURES: 1. Left shoulder arthroscopic acromioclavicular resection. 2. Left shoulder arthroscopic acromioplasty. 3. Left shoulder arthroscopic debridement.  ANESTHESIA:  General and block.  ATTENDING SURGEON:  Lubertha Basque. Jerl Santos, M.D.  ASSISTANT:  Dirk Dress, PA-C  INDICATION FOR PROCEDURE:  The patient is a 52 year old woman with a long history of left shoulder pain.  This has responded transiently to injection and she has failed therapy and oral anti-inflammatories.  By MRI scan, she has irritation of her rotator cuff, but no full-thickness tear.  She has pain at the [facility] joint, but also some pain with rotation. She has pain which limits her ability to rest and use her arm.  She is offered an arthroscopy.  Informed operative consent was obtained after discussion of possible complications including reaction to anesthesia and infection.  SUMMARY OF FINDINGS AND PROCEDURE:  Under general anesthesia and a block, a left shoulder arthroscopy was performed.  The glenohumeral joint did show some moderate and focal severe degenerative change for broad areas of grade 3 chondromalacia and tiny areas of grade 4 change. A thorough chondroplasty was done along with removal of some small cartilaginous loose bodies.  This did affect  both the glenoid and the humeral head.  The biceps tendon and rotator cuff appeared benign from below.  In the subacromial space, she had some irritation of her rotator cuff, some partial thickness tearing, but no tear worthy of repair.  A thorough bursectomy was done with debridement of the cuff.  She had a prominent subacromial morphology, addressed with an acromioplasty followed by a formal AC resection removing a cm of the distal clavicle. She was discharged home the same day.  DESCRIPTION OF PROCEDURE:  The patient was taken to the operating suite, where general anesthetic was applied without difficulty.  She was also given a block in the pre-anesthesia area.  She was positioned in a beach- chair position and prepped and draped in normal sterile fashion.  After the administration of preop IV Kefzol, an arthroscopy of the left shoulder was performed through total of 2 portals.  Findings were as noted above and procedure consisted predominantly of the acromioplasty which was done with a bur in the lateral position, followed by transfer of the bur to the posterior position.  We then performed the resection of the distal clavicle removing a cm of this bone decompressing the AC joint.  I debrided the rotator cuff, but no tear worthy of repair was found.  I again looked into the glenohumeral joint and debrided the chondromalacia there and removed the small cartilaginous loose  bodies. I placed a needle in the glenohumeral joint and after reapproximation of portals with nylon, I injected through this needle into the glenohumeral joint 40 mg of Depo-Medrol along with a small amount of Marcaine. Adaptic was applied to the portals, followed by dry gauze and tape. Estimated blood loss and fluids can be obtained from the anesthesia records.  DISPOSITION:  The patient was extubated in the operating room and taken to recovery room in stable condition.  She was to go home the same day and follow  up in the office closely.  I will contact her by phone tonight.     Lubertha Basque Jerl Santos, M.D.     PGD/MEDQ  D:  07/14/2013  T:  07/14/2013  Job:  [id number]

## 2013-07-14 NOTE — Op Note (Signed)
#  [id number] 

## 2013-07-20 ENCOUNTER — Encounter (HOSPITAL_BASED_OUTPATIENT_CLINIC_OR_DEPARTMENT_OTHER): Payer: Self-pay | Admitting: Orthopaedic Surgery

## 2014-02-25 ENCOUNTER — Other Ambulatory Visit: Payer: Self-pay | Admitting: Gastroenterology

## 2015-11-14 DIAGNOSIS — M170 Bilateral primary osteoarthritis of knee: Secondary | ICD-10-CM | POA: Diagnosis not present

## 2015-12-06 DIAGNOSIS — M1712 Unilateral primary osteoarthritis, left knee: Secondary | ICD-10-CM | POA: Diagnosis not present

## 2015-12-23 DIAGNOSIS — M170 Bilateral primary osteoarthritis of knee: Secondary | ICD-10-CM | POA: Diagnosis not present

## 2016-01-03 DIAGNOSIS — E119 Type 2 diabetes mellitus without complications: Secondary | ICD-10-CM | POA: Diagnosis not present

## 2016-01-03 DIAGNOSIS — E789 Disorder of lipoprotein metabolism, unspecified: Secondary | ICD-10-CM | POA: Diagnosis not present

## 2016-01-06 DIAGNOSIS — N3001 Acute cystitis with hematuria: Secondary | ICD-10-CM | POA: Diagnosis not present

## 2016-01-06 DIAGNOSIS — R300 Dysuria: Secondary | ICD-10-CM | POA: Diagnosis not present

## 2016-01-06 DIAGNOSIS — N3091 Cystitis, unspecified with hematuria: Secondary | ICD-10-CM | POA: Diagnosis not present

## 2016-01-11 DIAGNOSIS — E789 Disorder of lipoprotein metabolism, unspecified: Secondary | ICD-10-CM | POA: Diagnosis not present

## 2016-01-11 DIAGNOSIS — I10 Essential (primary) hypertension: Secondary | ICD-10-CM | POA: Diagnosis not present

## 2016-01-11 DIAGNOSIS — E032 Hypothyroidism due to medicaments and other exogenous substances: Secondary | ICD-10-CM | POA: Diagnosis not present

## 2016-01-19 DIAGNOSIS — E669 Obesity, unspecified: Secondary | ICD-10-CM | POA: Diagnosis not present

## 2016-01-19 DIAGNOSIS — Z6841 Body Mass Index (BMI) 40.0 and over, adult: Secondary | ICD-10-CM | POA: Diagnosis not present

## 2016-03-02 DIAGNOSIS — Z6841 Body Mass Index (BMI) 40.0 and over, adult: Secondary | ICD-10-CM | POA: Diagnosis not present

## 2016-03-02 DIAGNOSIS — E669 Obesity, unspecified: Secondary | ICD-10-CM | POA: Diagnosis not present

## 2016-03-13 DIAGNOSIS — M170 Bilateral primary osteoarthritis of knee: Secondary | ICD-10-CM | POA: Diagnosis not present

## 2016-04-26 DIAGNOSIS — E669 Obesity, unspecified: Secondary | ICD-10-CM | POA: Diagnosis not present

## 2016-04-26 DIAGNOSIS — Z6841 Body Mass Index (BMI) 40.0 and over, adult: Secondary | ICD-10-CM | POA: Diagnosis not present

## 2016-05-21 DIAGNOSIS — M170 Bilateral primary osteoarthritis of knee: Secondary | ICD-10-CM | POA: Diagnosis not present

## 2016-07-03 DIAGNOSIS — E669 Obesity, unspecified: Secondary | ICD-10-CM | POA: Diagnosis not present

## 2016-07-03 DIAGNOSIS — Z6841 Body Mass Index (BMI) 40.0 and over, adult: Secondary | ICD-10-CM | POA: Diagnosis not present

## 2016-07-05 ENCOUNTER — Ambulatory Visit (INDEPENDENT_AMBULATORY_CARE_PROVIDER_SITE_OTHER): Payer: Self-pay | Admitting: Orthopaedic Surgery

## 2016-07-06 ENCOUNTER — Ambulatory Visit (INDEPENDENT_AMBULATORY_CARE_PROVIDER_SITE_OTHER): Payer: Self-pay

## 2016-07-06 ENCOUNTER — Ambulatory Visit (INDEPENDENT_AMBULATORY_CARE_PROVIDER_SITE_OTHER): Payer: BLUE CROSS/BLUE SHIELD | Admitting: Orthopaedic Surgery

## 2016-07-06 ENCOUNTER — Encounter (INDEPENDENT_AMBULATORY_CARE_PROVIDER_SITE_OTHER): Payer: Self-pay | Admitting: Orthopaedic Surgery

## 2016-07-06 VITALS — Resp 14 | Ht 63.0 in | Wt 260.0 lb

## 2016-07-06 DIAGNOSIS — G8929 Other chronic pain: Secondary | ICD-10-CM

## 2016-07-06 DIAGNOSIS — M25562 Pain in left knee: Secondary | ICD-10-CM

## 2016-07-06 MED ORDER — METHOCARBAMOL 500 MG PO TABS: 500.0000 mg | ORAL_TABLET | Freq: Two times a day (BID) | ORAL | 0 refills | Status: AC

## 2016-07-06 NOTE — Progress Notes (Signed)
Office Visit Note   Patient: Wendy Reed           Date of Birth: 12-24-1960           MRN: [id number] Visit Date: 07/06/2016              Requested by: Darci NeedleWalter Kohut, MD [address] [place] [id number] PCP: Michiel SitesKOHUT,WALTER DENNIS, MD   Assessment & Plan: Visit Diagnoses: Nearly end-stage osteoarthritis left knee  Plan: Long discussion with Wendy Reed regarding her diagnosis. Her present BMI is 46. I think her risk for total knee replacement is quite significant. Therefore, we had a long discussion about continued weight loss and what she can expect over time. She will continue with her weight loss program her goal would be to lose another 50-60 pounds. That would lessen her knee MRI to approximately 35-36. I will also prescribed a muscle relaxant i.e. Robaxin see if that would help her at night  Follow-Up Instructions: No Follow-up on file.   Orders:  No orders of the defined types were placed in this encounter.  No orders of the defined types were placed in this encounter.     Procedures: No procedures performed   Clinical Data: No additional findings.   Subjective: Chief Complaint  Patient presents with  . Left Knee - Pain    Pt having left pain, saw an orthopedic, told her to lose weight, she lost 30 lbs.  He offered several cortisone shots  Wendy Reed has been suffering with left knee pain on a chronic basis. Followed by her primary care physician and [place] with cortisone and what I believe was Visco supplementation injection. She is frustrated with her pain with some difficulty sleeping. She has been seeing a nutritionist and has lost 30 pounds over the last 3-4 months. She presently weighs 260 pounds.  Review of Systems   Objective: Vital Signs: There were no vitals taken for this visit.  Physical Exam  Ortho Exam left knee exam reveals no evidence of effusion. Wendy Reed has large knees full extension and only 90 of flexion.  There is no instability but diffuse patellar crepitation and pain along the medial joint. Neurovascular exam is intact distally denies pain with range of motion of either hip. Straight leg raise is negative.  Specialty Comments:  No specialty comments available.  Imaging: No results found.   PMFS History: There are no active problems to display for this patient.  Past Medical History:  Diagnosis Date  . Arthritis   . Depression   . GERD (gastroesophageal reflux disease)   . Hyperlipemia   . Hypertension   . Hypothyroidism   . PONV (postoperative nausea and vomiting)     No family history on file.  Past Surgical History:  Procedure Laterality Date  . CARPAL TUNNEL RELEASE     right and left  . CESAREAN SECTION    . CHOLECYSTECTOMY  2010   lap choli  . SHOULDER ARTHROSCOPY Left 07/14/2013   Procedure: ARTHROSCOPY LEFT SHOULDER, SUBACROMIAL DECOMPRESSION, DISTAL CLAVICLE RESECTION;  Surgeon: Velna OchsPeter G Dalldorf, MD;  Location: [facility];  Service: Orthopedics;  Laterality: Left;  . TRIGGER FINGER RELEASE     lt thumb  . TUBAL LIGATION     Social History   Occupational History  . Not on file.   Social History Main Topics  . Smoking status: Former Smoker    Quit date: 07/08/2004  . Smokeless tobacco: Not on file  .  Alcohol use No  . Drug use: No  . Sexual activity: Not on file

## 2016-07-30 DIAGNOSIS — M170 Bilateral primary osteoarthritis of knee: Secondary | ICD-10-CM | POA: Diagnosis not present

## 2016-07-30 DIAGNOSIS — Z7982 Long term (current) use of aspirin: Secondary | ICD-10-CM | POA: Diagnosis not present

## 2016-07-30 DIAGNOSIS — M21161 Varus deformity, not elsewhere classified, right knee: Secondary | ICD-10-CM | POA: Diagnosis not present

## 2016-07-30 DIAGNOSIS — E7800 Pure hypercholesterolemia, unspecified: Secondary | ICD-10-CM | POA: Diagnosis not present

## 2016-07-30 DIAGNOSIS — M21162 Varus deformity, not elsewhere classified, left knee: Secondary | ICD-10-CM | POA: Diagnosis not present

## 2016-07-30 DIAGNOSIS — I10 Essential (primary) hypertension: Secondary | ICD-10-CM | POA: Diagnosis not present

## 2016-07-30 DIAGNOSIS — Z885 Allergy status to narcotic agent status: Secondary | ICD-10-CM | POA: Diagnosis not present

## 2016-07-30 DIAGNOSIS — Z79899 Other long term (current) drug therapy: Secondary | ICD-10-CM | POA: Diagnosis not present

## 2016-07-30 DIAGNOSIS — Z87891 Personal history of nicotine dependence: Secondary | ICD-10-CM | POA: Diagnosis not present

## 2016-07-30 DIAGNOSIS — E039 Hypothyroidism, unspecified: Secondary | ICD-10-CM | POA: Diagnosis not present

## 2016-07-30 DIAGNOSIS — Z880 Allergy status to penicillin: Secondary | ICD-10-CM | POA: Diagnosis not present

## 2016-08-28 DIAGNOSIS — E119 Type 2 diabetes mellitus without complications: Secondary | ICD-10-CM | POA: Diagnosis not present

## 2016-08-28 DIAGNOSIS — E034 Atrophy of thyroid (acquired): Secondary | ICD-10-CM | POA: Diagnosis not present

## 2016-08-28 DIAGNOSIS — E789 Disorder of lipoprotein metabolism, unspecified: Secondary | ICD-10-CM | POA: Diagnosis not present

## 2016-08-29 DIAGNOSIS — M170 Bilateral primary osteoarthritis of knee: Secondary | ICD-10-CM | POA: Diagnosis not present

## 2016-08-29 DIAGNOSIS — Z6841 Body Mass Index (BMI) 40.0 and over, adult: Secondary | ICD-10-CM | POA: Diagnosis not present

## 2016-09-04 DIAGNOSIS — M1990 Unspecified osteoarthritis, unspecified site: Secondary | ICD-10-CM | POA: Diagnosis not present

## 2016-09-04 DIAGNOSIS — I10 Essential (primary) hypertension: Secondary | ICD-10-CM | POA: Diagnosis not present

## 2016-09-04 DIAGNOSIS — E669 Obesity, unspecified: Secondary | ICD-10-CM | POA: Diagnosis not present

## 2016-09-04 DIAGNOSIS — E789 Disorder of lipoprotein metabolism, unspecified: Secondary | ICD-10-CM | POA: Diagnosis not present

## 2016-09-05 DIAGNOSIS — M170 Bilateral primary osteoarthritis of knee: Secondary | ICD-10-CM | POA: Diagnosis not present

## 2016-09-12 DIAGNOSIS — M170 Bilateral primary osteoarthritis of knee: Secondary | ICD-10-CM | POA: Diagnosis not present

## 2016-09-13 DIAGNOSIS — E669 Obesity, unspecified: Secondary | ICD-10-CM | POA: Diagnosis not present

## 2016-09-13 DIAGNOSIS — Z6841 Body Mass Index (BMI) 40.0 and over, adult: Secondary | ICD-10-CM | POA: Diagnosis not present

## 2016-11-06 DIAGNOSIS — Z6841 Body Mass Index (BMI) 40.0 and over, adult: Secondary | ICD-10-CM | POA: Diagnosis not present

## 2016-11-06 DIAGNOSIS — E669 Obesity, unspecified: Secondary | ICD-10-CM | POA: Diagnosis not present

## 2016-12-31 DIAGNOSIS — E669 Obesity, unspecified: Secondary | ICD-10-CM | POA: Diagnosis not present

## 2016-12-31 DIAGNOSIS — Z6841 Body Mass Index (BMI) 40.0 and over, adult: Secondary | ICD-10-CM | POA: Diagnosis not present

## 2017-01-08 DIAGNOSIS — E032 Hypothyroidism due to medicaments and other exogenous substances: Secondary | ICD-10-CM | POA: Diagnosis not present

## 2017-01-08 DIAGNOSIS — I10 Essential (primary) hypertension: Secondary | ICD-10-CM | POA: Diagnosis not present

## 2017-01-14 DIAGNOSIS — M170 Bilateral primary osteoarthritis of knee: Secondary | ICD-10-CM | POA: Diagnosis not present

## 2017-01-14 DIAGNOSIS — I10 Essential (primary) hypertension: Secondary | ICD-10-CM | POA: Diagnosis not present

## 2017-01-14 DIAGNOSIS — E039 Hypothyroidism, unspecified: Secondary | ICD-10-CM | POA: Diagnosis not present

## 2017-01-14 DIAGNOSIS — E7800 Pure hypercholesterolemia, unspecified: Secondary | ICD-10-CM | POA: Diagnosis not present

## 2017-01-14 DIAGNOSIS — Z87891 Personal history of nicotine dependence: Secondary | ICD-10-CM | POA: Diagnosis not present

## 2017-01-14 DIAGNOSIS — Z880 Allergy status to penicillin: Secondary | ICD-10-CM | POA: Diagnosis not present

## 2017-01-14 DIAGNOSIS — E785 Hyperlipidemia, unspecified: Secondary | ICD-10-CM | POA: Diagnosis not present

## 2017-01-14 DIAGNOSIS — Z885 Allergy status to narcotic agent status: Secondary | ICD-10-CM | POA: Diagnosis not present

## 2017-05-24 DIAGNOSIS — Z23 Encounter for immunization: Secondary | ICD-10-CM | POA: Diagnosis not present

## 2017-05-24 DIAGNOSIS — I10 Essential (primary) hypertension: Secondary | ICD-10-CM | POA: Diagnosis not present

## 2017-05-24 DIAGNOSIS — Z6841 Body Mass Index (BMI) 40.0 and over, adult: Secondary | ICD-10-CM | POA: Diagnosis not present

## 2017-05-24 DIAGNOSIS — E785 Hyperlipidemia, unspecified: Secondary | ICD-10-CM | POA: Diagnosis not present

## 2017-07-03 DIAGNOSIS — E032 Hypothyroidism due to medicaments and other exogenous substances: Secondary | ICD-10-CM | POA: Diagnosis not present

## 2017-07-09 DIAGNOSIS — Z6841 Body Mass Index (BMI) 40.0 and over, adult: Secondary | ICD-10-CM | POA: Diagnosis not present

## 2017-07-09 DIAGNOSIS — E039 Hypothyroidism, unspecified: Secondary | ICD-10-CM | POA: Diagnosis not present

## 2017-07-09 DIAGNOSIS — E782 Mixed hyperlipidemia: Secondary | ICD-10-CM | POA: Diagnosis not present

## 2017-07-09 DIAGNOSIS — E042 Nontoxic multinodular goiter: Secondary | ICD-10-CM | POA: Diagnosis not present

## 2017-07-10 ENCOUNTER — Other Ambulatory Visit: Payer: Self-pay | Admitting: Internal Medicine

## 2017-07-10 DIAGNOSIS — E041 Nontoxic single thyroid nodule: Secondary | ICD-10-CM

## 2017-07-12 ENCOUNTER — Ambulatory Visit
Admission: RE | Admit: 2017-07-12 | Discharge: 2017-07-12 | Disposition: A | Discharge status: Home / Self Care | Payer: BLUE CROSS/BLUE SHIELD | Source: Ambulatory Visit | Attending: Internal Medicine | Admitting: Internal Medicine

## 2017-07-12 DIAGNOSIS — E042 Nontoxic multinodular goiter: Secondary | ICD-10-CM | POA: Diagnosis not present

## 2017-07-12 DIAGNOSIS — E041 Nontoxic single thyroid nodule: Secondary | ICD-10-CM

## 2017-09-24 DIAGNOSIS — Z1231 Encounter for screening mammogram for malignant neoplasm of breast: Secondary | ICD-10-CM | POA: Diagnosis not present

## 2017-10-11 DIAGNOSIS — M25562 Pain in left knee: Secondary | ICD-10-CM | POA: Diagnosis not present

## 2017-10-11 DIAGNOSIS — M25561 Pain in right knee: Secondary | ICD-10-CM | POA: Diagnosis not present

## 2017-10-11 DIAGNOSIS — M1712 Unilateral primary osteoarthritis, left knee: Secondary | ICD-10-CM | POA: Diagnosis not present

## 2017-10-15 DIAGNOSIS — E039 Hypothyroidism, unspecified: Secondary | ICD-10-CM | POA: Diagnosis not present

## 2017-10-15 DIAGNOSIS — Z6841 Body Mass Index (BMI) 40.0 and over, adult: Secondary | ICD-10-CM | POA: Diagnosis not present

## 2017-10-15 DIAGNOSIS — E782 Mixed hyperlipidemia: Secondary | ICD-10-CM | POA: Diagnosis not present

## 2017-10-15 DIAGNOSIS — E042 Nontoxic multinodular goiter: Secondary | ICD-10-CM | POA: Diagnosis not present

## 2018-01-07 DIAGNOSIS — E039 Hypothyroidism, unspecified: Secondary | ICD-10-CM | POA: Diagnosis not present

## 2018-01-14 DIAGNOSIS — E042 Nontoxic multinodular goiter: Secondary | ICD-10-CM | POA: Diagnosis not present

## 2018-01-14 DIAGNOSIS — E782 Mixed hyperlipidemia: Secondary | ICD-10-CM | POA: Diagnosis not present

## 2018-01-14 DIAGNOSIS — Z6841 Body Mass Index (BMI) 40.0 and over, adult: Secondary | ICD-10-CM | POA: Diagnosis not present

## 2018-01-14 DIAGNOSIS — E039 Hypothyroidism, unspecified: Secondary | ICD-10-CM | POA: Diagnosis not present

## 2018-01-17 DIAGNOSIS — M1712 Unilateral primary osteoarthritis, left knee: Secondary | ICD-10-CM | POA: Diagnosis not present

## 2018-02-28 DIAGNOSIS — M1712 Unilateral primary osteoarthritis, left knee: Secondary | ICD-10-CM | POA: Diagnosis not present

## 2018-05-02 DIAGNOSIS — Z23 Encounter for immunization: Secondary | ICD-10-CM | POA: Diagnosis not present

## 2018-05-02 DIAGNOSIS — I10 Essential (primary) hypertension: Secondary | ICD-10-CM | POA: Diagnosis not present

## 2018-05-02 DIAGNOSIS — E785 Hyperlipidemia, unspecified: Secondary | ICD-10-CM | POA: Diagnosis not present

## 2018-05-02 DIAGNOSIS — Z6841 Body Mass Index (BMI) 40.0 and over, adult: Secondary | ICD-10-CM | POA: Diagnosis not present

## 2018-06-27 DIAGNOSIS — Z961 Presence of intraocular lens: Secondary | ICD-10-CM | POA: Diagnosis not present

## 2018-08-29 DIAGNOSIS — M25562 Pain in left knee: Secondary | ICD-10-CM | POA: Diagnosis not present

## 2018-08-29 DIAGNOSIS — M1712 Unilateral primary osteoarthritis, left knee: Secondary | ICD-10-CM | POA: Diagnosis not present

## 2018-12-29 DIAGNOSIS — E042 Nontoxic multinodular goiter: Secondary | ICD-10-CM | POA: Diagnosis not present

## 2018-12-29 DIAGNOSIS — E039 Hypothyroidism, unspecified: Secondary | ICD-10-CM | POA: Diagnosis not present

## 2018-12-29 DIAGNOSIS — I10 Essential (primary) hypertension: Secondary | ICD-10-CM | POA: Diagnosis not present

## 2018-12-29 DIAGNOSIS — E782 Mixed hyperlipidemia: Secondary | ICD-10-CM | POA: Diagnosis not present

## 2019-01-26 DIAGNOSIS — E039 Hypothyroidism, unspecified: Secondary | ICD-10-CM | POA: Diagnosis not present

## 2019-01-26 DIAGNOSIS — E782 Mixed hyperlipidemia: Secondary | ICD-10-CM | POA: Diagnosis not present

## 2019-01-26 DIAGNOSIS — I10 Essential (primary) hypertension: Secondary | ICD-10-CM | POA: Diagnosis not present

## 2019-01-26 DIAGNOSIS — E042 Nontoxic multinodular goiter: Secondary | ICD-10-CM | POA: Diagnosis not present

## 2019-03-09 DIAGNOSIS — E039 Hypothyroidism, unspecified: Secondary | ICD-10-CM | POA: Diagnosis not present

## 2019-03-19 DIAGNOSIS — I10 Essential (primary) hypertension: Secondary | ICD-10-CM | POA: Diagnosis not present

## 2019-03-19 DIAGNOSIS — E782 Mixed hyperlipidemia: Secondary | ICD-10-CM | POA: Diagnosis not present

## 2019-03-19 DIAGNOSIS — E042 Nontoxic multinodular goiter: Secondary | ICD-10-CM | POA: Diagnosis not present

## 2019-03-19 DIAGNOSIS — E039 Hypothyroidism, unspecified: Secondary | ICD-10-CM | POA: Diagnosis not present

## 2019-04-27 DIAGNOSIS — H9201 Otalgia, right ear: Secondary | ICD-10-CM | POA: Diagnosis not present

## 2019-04-30 DIAGNOSIS — M25562 Pain in left knee: Secondary | ICD-10-CM | POA: Diagnosis not present

## 2019-05-14 DIAGNOSIS — E039 Hypothyroidism, unspecified: Secondary | ICD-10-CM | POA: Diagnosis not present

## 2019-05-15 DIAGNOSIS — R7309 Other abnormal glucose: Secondary | ICD-10-CM | POA: Diagnosis not present

## 2019-05-15 DIAGNOSIS — E785 Hyperlipidemia, unspecified: Secondary | ICD-10-CM | POA: Diagnosis not present

## 2019-05-15 DIAGNOSIS — Z6839 Body mass index (BMI) 39.0-39.9, adult: Secondary | ICD-10-CM | POA: Diagnosis not present

## 2019-05-15 DIAGNOSIS — E039 Hypothyroidism, unspecified: Secondary | ICD-10-CM | POA: Diagnosis not present

## 2019-05-15 DIAGNOSIS — I10 Essential (primary) hypertension: Secondary | ICD-10-CM | POA: Diagnosis not present

## 2019-05-25 DIAGNOSIS — M25562 Pain in left knee: Secondary | ICD-10-CM | POA: Diagnosis not present

## 2019-05-25 DIAGNOSIS — M25662 Stiffness of left knee, not elsewhere classified: Secondary | ICD-10-CM | POA: Diagnosis not present

## 2019-05-25 DIAGNOSIS — M1712 Unilateral primary osteoarthritis, left knee: Secondary | ICD-10-CM | POA: Diagnosis not present

## 2019-05-27 DIAGNOSIS — Z6839 Body mass index (BMI) 39.0-39.9, adult: Secondary | ICD-10-CM | POA: Diagnosis not present

## 2019-05-27 DIAGNOSIS — E042 Nontoxic multinodular goiter: Secondary | ICD-10-CM | POA: Diagnosis not present

## 2019-05-27 DIAGNOSIS — E039 Hypothyroidism, unspecified: Secondary | ICD-10-CM | POA: Diagnosis not present

## 2019-05-27 DIAGNOSIS — E782 Mixed hyperlipidemia: Secondary | ICD-10-CM | POA: Diagnosis not present

## 2019-06-12 DIAGNOSIS — Z01818 Encounter for other preprocedural examination: Secondary | ICD-10-CM | POA: Diagnosis not present

## 2019-06-30 DIAGNOSIS — M1712 Unilateral primary osteoarthritis, left knee: Secondary | ICD-10-CM | POA: Diagnosis not present

## 2019-06-30 DIAGNOSIS — Z96651 Presence of right artificial knee joint: Secondary | ICD-10-CM | POA: Diagnosis not present

## 2019-08-03 DIAGNOSIS — H209 Unspecified iridocyclitis: Secondary | ICD-10-CM | POA: Diagnosis not present

## 2019-08-03 DIAGNOSIS — H2000 Unspecified acute and subacute iridocyclitis: Secondary | ICD-10-CM | POA: Diagnosis not present

## 2019-08-04 DIAGNOSIS — Z471 Aftercare following joint replacement surgery: Secondary | ICD-10-CM | POA: Diagnosis not present

## 2019-08-04 DIAGNOSIS — Z96652 Presence of left artificial knee joint: Secondary | ICD-10-CM | POA: Diagnosis not present

## 2019-08-07 DIAGNOSIS — H209 Unspecified iridocyclitis: Secondary | ICD-10-CM | POA: Diagnosis not present

## 2019-08-17 DIAGNOSIS — H209 Unspecified iridocyclitis: Secondary | ICD-10-CM | POA: Diagnosis not present

## 2019-10-15 DIAGNOSIS — E119 Type 2 diabetes mellitus without complications: Secondary | ICD-10-CM | POA: Diagnosis not present

## 2019-10-15 DIAGNOSIS — E039 Hypothyroidism, unspecified: Secondary | ICD-10-CM | POA: Diagnosis not present

## 2019-10-15 DIAGNOSIS — E789 Disorder of lipoprotein metabolism, unspecified: Secondary | ICD-10-CM | POA: Diagnosis not present

## 2019-10-21 DIAGNOSIS — E782 Mixed hyperlipidemia: Secondary | ICD-10-CM | POA: Diagnosis not present

## 2019-10-21 DIAGNOSIS — E042 Nontoxic multinodular goiter: Secondary | ICD-10-CM | POA: Diagnosis not present

## 2019-10-21 DIAGNOSIS — Z6837 Body mass index (BMI) 37.0-37.9, adult: Secondary | ICD-10-CM | POA: Diagnosis not present

## 2019-10-21 DIAGNOSIS — E039 Hypothyroidism, unspecified: Secondary | ICD-10-CM | POA: Diagnosis not present

## 2020-01-18 DIAGNOSIS — E782 Mixed hyperlipidemia: Secondary | ICD-10-CM | POA: Diagnosis not present

## 2020-01-18 DIAGNOSIS — E039 Hypothyroidism, unspecified: Secondary | ICD-10-CM | POA: Diagnosis not present

## 2020-01-27 DIAGNOSIS — E042 Nontoxic multinodular goiter: Secondary | ICD-10-CM | POA: Diagnosis not present

## 2020-01-27 DIAGNOSIS — E782 Mixed hyperlipidemia: Secondary | ICD-10-CM | POA: Diagnosis not present

## 2020-01-27 DIAGNOSIS — E039 Hypothyroidism, unspecified: Secondary | ICD-10-CM | POA: Diagnosis not present

## 2020-01-27 DIAGNOSIS — E669 Obesity, unspecified: Secondary | ICD-10-CM | POA: Diagnosis not present

## 2020-02-12 DIAGNOSIS — M1712 Unilateral primary osteoarthritis, left knee: Secondary | ICD-10-CM | POA: Diagnosis not present

## 2020-02-12 DIAGNOSIS — Z96652 Presence of left artificial knee joint: Secondary | ICD-10-CM | POA: Diagnosis not present

## 2020-02-12 DIAGNOSIS — M79662 Pain in left lower leg: Secondary | ICD-10-CM | POA: Diagnosis not present

## 2020-07-12 DIAGNOSIS — Z1231 Encounter for screening mammogram for malignant neoplasm of breast: Secondary | ICD-10-CM | POA: Diagnosis not present

## 2020-07-14 DIAGNOSIS — Z96652 Presence of left artificial knee joint: Secondary | ICD-10-CM | POA: Diagnosis not present

## 2020-07-21 DIAGNOSIS — E785 Hyperlipidemia, unspecified: Secondary | ICD-10-CM | POA: Diagnosis not present

## 2020-07-21 DIAGNOSIS — I10 Essential (primary) hypertension: Secondary | ICD-10-CM | POA: Diagnosis not present

## 2020-07-21 DIAGNOSIS — Z6838 Body mass index (BMI) 38.0-38.9, adult: Secondary | ICD-10-CM | POA: Diagnosis not present

## 2020-07-21 DIAGNOSIS — E039 Hypothyroidism, unspecified: Secondary | ICD-10-CM | POA: Diagnosis not present

## 2020-08-04 DIAGNOSIS — N179 Acute kidney failure, unspecified: Secondary | ICD-10-CM | POA: Diagnosis not present

## 2020-08-04 DIAGNOSIS — E872 Acidosis: Secondary | ICD-10-CM | POA: Diagnosis not present

## 2020-08-04 DIAGNOSIS — J189 Pneumonia, unspecified organism: Secondary | ICD-10-CM | POA: Diagnosis not present

## 2020-08-04 DIAGNOSIS — R6521 Severe sepsis with septic shock: Secondary | ICD-10-CM | POA: Diagnosis not present

## 2020-08-04 DIAGNOSIS — J1282 Pneumonia due to coronavirus disease 2019: Secondary | ICD-10-CM | POA: Diagnosis not present

## 2020-08-04 DIAGNOSIS — Z885 Allergy status to narcotic agent status: Secondary | ICD-10-CM | POA: Diagnosis not present

## 2020-08-04 DIAGNOSIS — R918 Other nonspecific abnormal finding of lung field: Secondary | ICD-10-CM | POA: Diagnosis not present

## 2020-08-04 DIAGNOSIS — Z452 Encounter for adjustment and management of vascular access device: Secondary | ICD-10-CM | POA: Diagnosis not present

## 2020-08-04 DIAGNOSIS — J9601 Acute respiratory failure with hypoxia: Secondary | ICD-10-CM | POA: Diagnosis not present

## 2020-08-04 DIAGNOSIS — I959 Hypotension, unspecified: Secondary | ICD-10-CM | POA: Diagnosis not present

## 2020-08-04 DIAGNOSIS — A4189 Other specified sepsis: Secondary | ICD-10-CM | POA: Diagnosis not present

## 2020-08-04 DIAGNOSIS — Z7982 Long term (current) use of aspirin: Secondary | ICD-10-CM | POA: Diagnosis not present

## 2020-08-04 DIAGNOSIS — R0602 Shortness of breath: Secondary | ICD-10-CM | POA: Diagnosis not present

## 2020-08-04 DIAGNOSIS — Z87891 Personal history of nicotine dependence: Secondary | ICD-10-CM | POA: Diagnosis not present

## 2020-08-04 DIAGNOSIS — Z4682 Encounter for fitting and adjustment of non-vascular catheter: Secondary | ICD-10-CM | POA: Diagnosis not present

## 2020-08-04 DIAGNOSIS — Z880 Allergy status to penicillin: Secondary | ICD-10-CM | POA: Diagnosis not present

## 2020-08-04 DIAGNOSIS — R7989 Other specified abnormal findings of blood chemistry: Secondary | ICD-10-CM | POA: Diagnosis not present

## 2020-08-04 DIAGNOSIS — R52 Pain, unspecified: Secondary | ICD-10-CM | POA: Diagnosis not present

## 2020-08-04 DIAGNOSIS — U071 COVID-19: Secondary | ICD-10-CM | POA: Diagnosis not present

## 2020-08-04 DIAGNOSIS — E039 Hypothyroidism, unspecified: Secondary | ICD-10-CM | POA: Diagnosis not present

## 2020-08-04 DIAGNOSIS — R0902 Hypoxemia: Secondary | ICD-10-CM | POA: Diagnosis not present

## 2020-08-04 DIAGNOSIS — J9600 Acute respiratory failure, unspecified whether with hypoxia or hypercapnia: Secondary | ICD-10-CM | POA: Diagnosis not present

## 2020-08-04 DIAGNOSIS — J151 Pneumonia due to Pseudomonas: Secondary | ICD-10-CM | POA: Diagnosis not present

## 2020-08-04 DIAGNOSIS — I10 Essential (primary) hypertension: Secondary | ICD-10-CM | POA: Diagnosis not present

## 2020-08-23 DEATH — deceased
